# Patient Record
Sex: Female | Born: 1945 | Race: White | Hispanic: No | Marital: Single | Smoking: Never smoker
Health system: Southern US, Community
[De-identification: ages and names within clinical notes are randomized; demographics above are authoritative.]

## PROBLEM LIST (undated history)

## (undated) DIAGNOSIS — K219 Gastro-esophageal reflux disease without esophagitis: Secondary | ICD-10-CM

## (undated) DIAGNOSIS — I10 Essential (primary) hypertension: Secondary | ICD-10-CM

## (undated) DIAGNOSIS — J45909 Unspecified asthma, uncomplicated: Secondary | ICD-10-CM

---

## 2013-04-06 ENCOUNTER — Ambulatory Visit: Payer: Self-pay | Admitting: Family Medicine

## 2013-05-26 DIAGNOSIS — R079 Chest pain, unspecified: Secondary | ICD-10-CM | POA: Insufficient documentation

## 2014-06-30 DIAGNOSIS — K219 Gastro-esophageal reflux disease without esophagitis: Secondary | ICD-10-CM | POA: Insufficient documentation

## 2017-02-05 ENCOUNTER — Other Ambulatory Visit: Payer: Self-pay | Admitting: Family Medicine

## 2017-02-05 DIAGNOSIS — N6320 Unspecified lump in the left breast, unspecified quadrant: Secondary | ICD-10-CM

## 2017-02-25 ENCOUNTER — Ambulatory Visit
Admission: RE | Admit: 2017-02-25 | Discharge: 2017-02-25 | Disposition: A | Discharge status: Home / Self Care | Payer: Medicare Other | Source: Ambulatory Visit | Attending: Family Medicine | Admitting: Family Medicine

## 2017-02-25 ENCOUNTER — Encounter: Payer: Self-pay | Admitting: Radiology

## 2017-02-25 ENCOUNTER — Other Ambulatory Visit: Payer: Self-pay | Admitting: Family Medicine

## 2017-02-25 DIAGNOSIS — N6002 Solitary cyst of left breast: Secondary | ICD-10-CM | POA: Insufficient documentation

## 2017-02-25 DIAGNOSIS — N6310 Unspecified lump in the right breast, unspecified quadrant: Secondary | ICD-10-CM

## 2017-02-25 DIAGNOSIS — N6001 Solitary cyst of right breast: Secondary | ICD-10-CM | POA: Insufficient documentation

## 2017-02-25 DIAGNOSIS — N6320 Unspecified lump in the left breast, unspecified quadrant: Secondary | ICD-10-CM

## 2017-02-25 DIAGNOSIS — R928 Other abnormal and inconclusive findings on diagnostic imaging of breast: Secondary | ICD-10-CM | POA: Diagnosis present

## 2017-02-26 ENCOUNTER — Other Ambulatory Visit: Payer: Self-pay | Admitting: Family Medicine

## 2017-02-26 DIAGNOSIS — N6002 Solitary cyst of left breast: Secondary | ICD-10-CM

## 2017-02-26 DIAGNOSIS — R928 Other abnormal and inconclusive findings on diagnostic imaging of breast: Secondary | ICD-10-CM

## 2017-03-18 ENCOUNTER — Ambulatory Visit
Admission: RE | Admit: 2017-03-18 | Discharge: 2017-03-18 | Disposition: A | Discharge status: Home / Self Care | Payer: Medicare Other | Source: Ambulatory Visit | Attending: Family Medicine | Admitting: Family Medicine

## 2017-03-18 ENCOUNTER — Other Ambulatory Visit: Payer: Self-pay | Admitting: Family Medicine

## 2017-03-18 DIAGNOSIS — N6002 Solitary cyst of left breast: Secondary | ICD-10-CM | POA: Insufficient documentation

## 2017-03-18 DIAGNOSIS — R928 Other abnormal and inconclusive findings on diagnostic imaging of breast: Secondary | ICD-10-CM

## 2017-03-18 HISTORY — PX: BREAST CYST ASPIRATION: SHX578

## 2018-08-07 IMAGING — MG US BREAST CYST ASPIRATION 1ST CYST
1 series · 5 of 5 positions shown · non-contrast
Comparison: Previous exams.

CLINICAL DATA: Left breast probable cyst for aspiration.

EXAM:
ULTRASOUND GUIDED LEFT BREAST CYST ASPIRATION

[Series 1: MG view · 0.05mm/px · 5 of 5 slices shown]
[im 1/5]
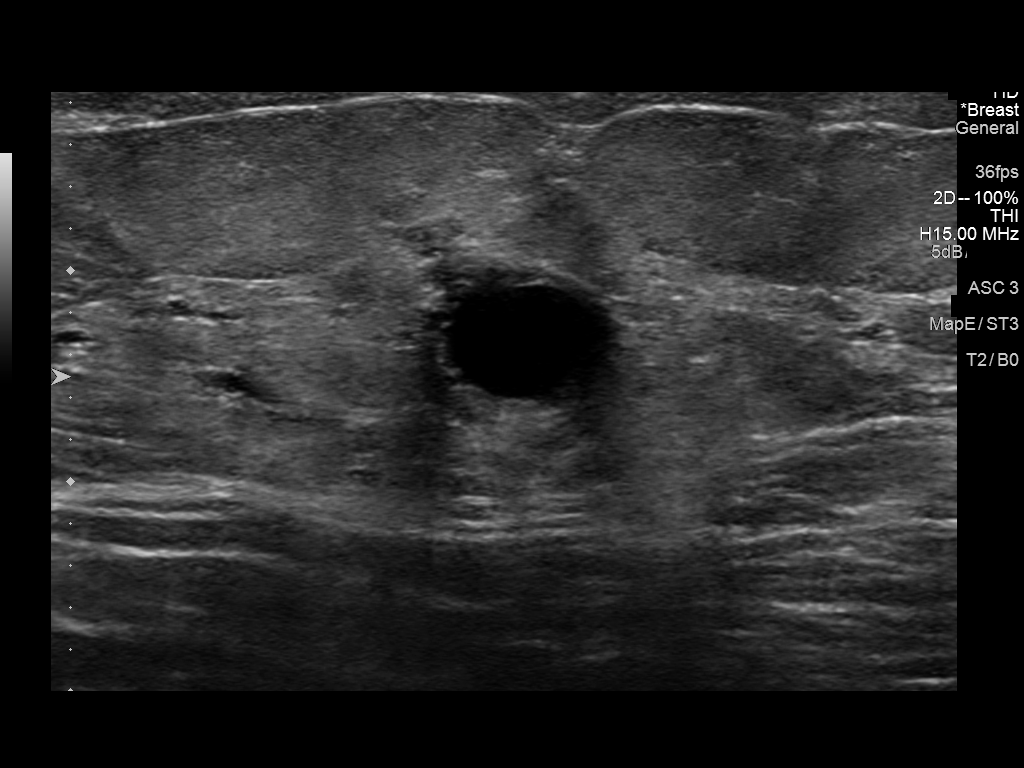
[im 2/5]
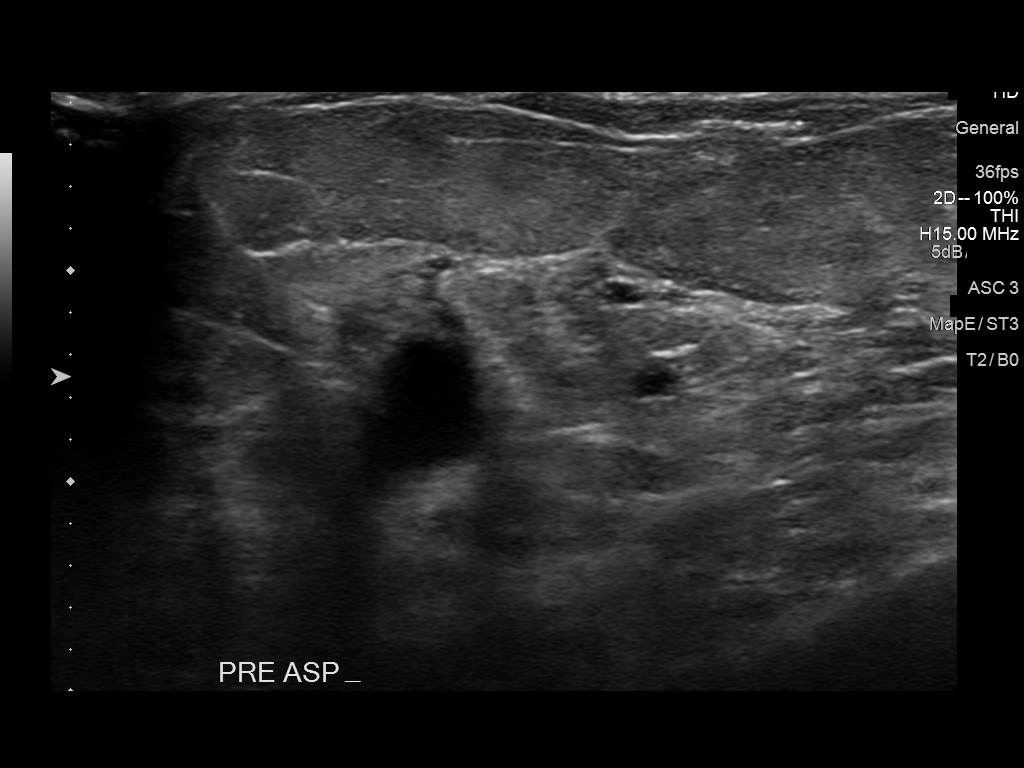
[im 3/5]
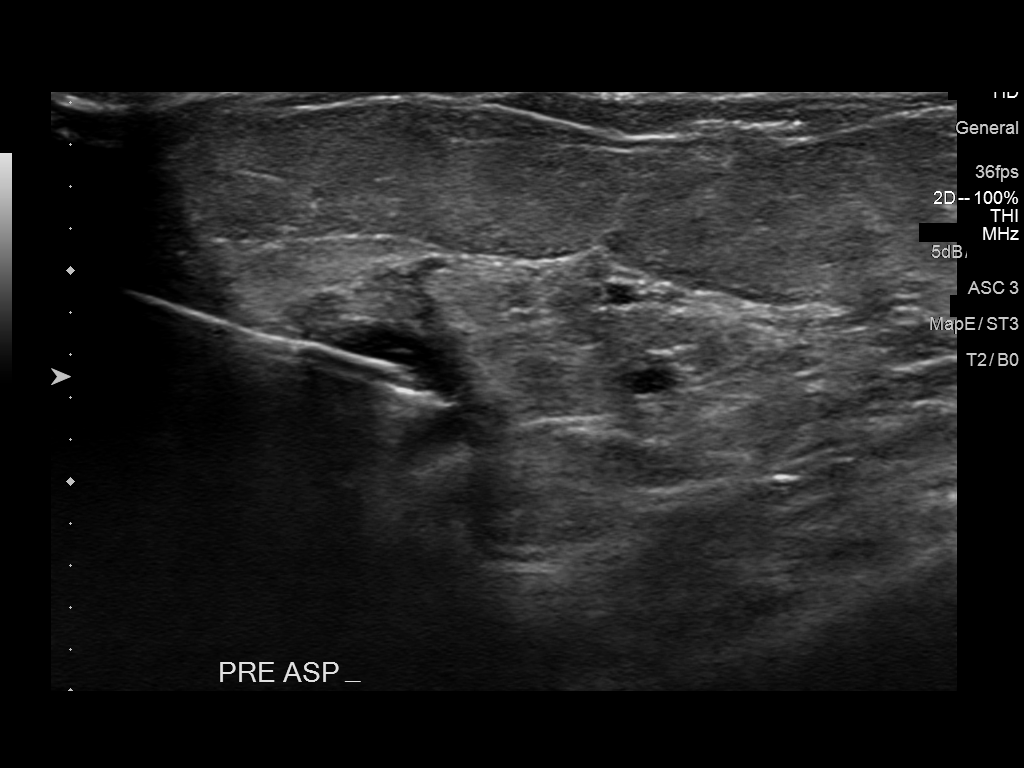
[im 4/5]
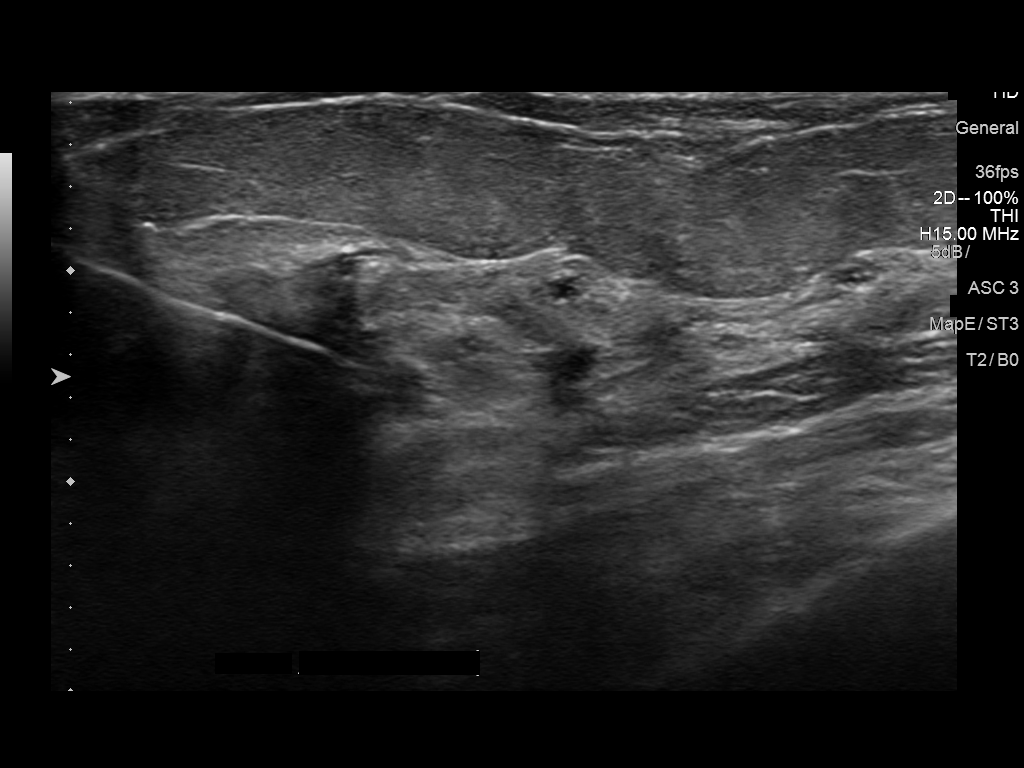
[im 5/5]
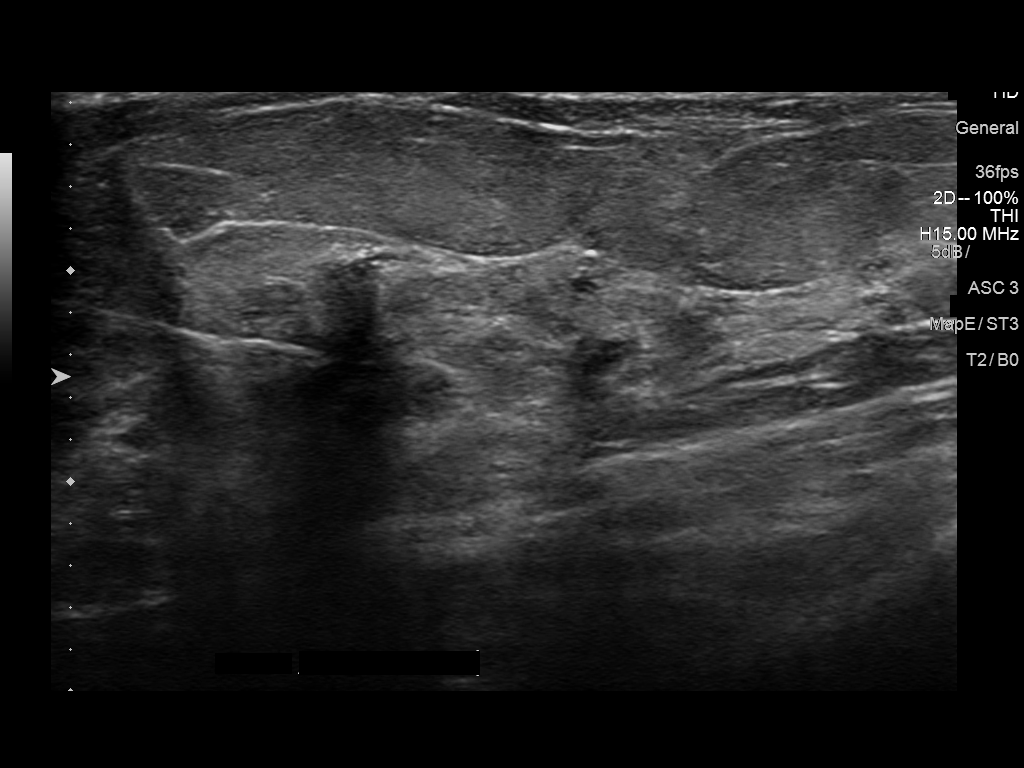

[5 of 5 positions shown; findings below may reference images not displayed]

PROCEDURE:
Using sterile technique, 1% lidocaine, under direct ultrasound
visualization, needle aspiration of anechoic lesion at the left
breast 12:30 o'clock 1 cm from nipple was performed. 0.25 mL of
slight yellow fluid was aspirated. There is complete collapse of the
mass post aspiration.
IMPRESSION: Ultrasound-guided aspiration of left breast cyst no apparent
complications.

RECOMMENDATIONS:
Routine screening mammogram back on schedule.

## 2018-10-08 DIAGNOSIS — E785 Hyperlipidemia, unspecified: Secondary | ICD-10-CM | POA: Insufficient documentation

## 2018-10-08 DIAGNOSIS — K920 Hematemesis: Secondary | ICD-10-CM | POA: Insufficient documentation

## 2018-10-08 DIAGNOSIS — I10 Essential (primary) hypertension: Secondary | ICD-10-CM | POA: Insufficient documentation

## 2018-10-08 DIAGNOSIS — K922 Gastrointestinal hemorrhage, unspecified: Secondary | ICD-10-CM | POA: Insufficient documentation

## 2018-10-08 DIAGNOSIS — R571 Hypovolemic shock: Secondary | ICD-10-CM | POA: Insufficient documentation

## 2018-10-08 DIAGNOSIS — K921 Melena: Secondary | ICD-10-CM | POA: Insufficient documentation

## 2019-01-28 ENCOUNTER — Encounter: Payer: Self-pay | Admitting: Gastroenterology

## 2019-01-28 ENCOUNTER — Other Ambulatory Visit: Payer: Self-pay

## 2019-01-28 ENCOUNTER — Encounter (INDEPENDENT_AMBULATORY_CARE_PROVIDER_SITE_OTHER): Payer: Self-pay

## 2019-01-28 ENCOUNTER — Ambulatory Visit (INDEPENDENT_AMBULATORY_CARE_PROVIDER_SITE_OTHER): Payer: Medicare Other | Admitting: Gastroenterology

## 2019-01-28 VITALS — BP 130/78 | HR 93 | Temp 96.1°F | Ht 63.5 in | Wt 173.2 lb

## 2019-01-28 DIAGNOSIS — H3530 Unspecified macular degeneration: Secondary | ICD-10-CM | POA: Insufficient documentation

## 2019-01-28 DIAGNOSIS — K254 Chronic or unspecified gastric ulcer with hemorrhage: Secondary | ICD-10-CM

## 2019-01-28 NOTE — H&P (View-Only) (Signed)
Gastroenterology Consultation  Referring Provider:     Hortencia Pilar, MD Primary Care Physician:  Tina Pilar, MD Primary Gastroenterologist:  Dr. Allen Mcintyre     Reason for Consultation:     GI bleed        HPI:   Tina Mcintyre is a 73 y.o. y/o female referred for consultation & management of GI bleed by Dr. Hortencia Pilar, MD.  This patient comes to see me today after being referred by her primary care physician for a history of peptic ulcer disease.  The patient was seen by her PCP in November and referred to me.  Prior to this the patient was in the hospital at [facility] for a GI bleed.  The patient had an upper endoscopy that showed her to have a nonbleeding gastric ulcer.  The recommendations were Protonix twice a day for 8 weeks and a repeat EGD to assess response.  The patient had reported that she was feeling weak during the summer with inability to finish mowing the lawn at times.  During her admission the patient was found to have a hemoglobin of 12 4 that went down to 9.3 the day after admission.  This was on September 9 and 10.  Repeat blood work on 28 September showed the hemoglobin to have risen to 12.1 and on 30 November the hemoglobin was 13.5.  The patient had reported some continued heartburn despite taking a PPI but she reports the symptoms to be much less on medication.  History reviewed. No pertinent past medical history.  Past Surgical History:  Procedure Laterality Date  . BREAST CYST ASPIRATION Left 03/18/2017    Prior to Admission medications   Not on File    Family History  Problem Relation Age of Onset  . Breast cancer Maternal Grandmother 65  . Breast cancer Cousin 68     Social History   Tobacco Use  . Smoking status: Never Smoker  . Smokeless tobacco: Never Used  Substance Use Topics  . Alcohol use: Never  . Drug use: Never    Allergies as of 01/28/2019 - Review Complete 01/28/2019  Allergen Reaction Noted  . Penicillins Anaphylaxis  01/08/2011  . Benzonatate  02/04/2012  . Codeine  01/31/2012  . Hydrocodone  01/31/2012  . Peanut oil Hives 07/17/2011  . Eggs or egg-derived products Nausea And Vomiting and Other (See Comments) 05/25/2013    Review of Systems:    All systems reviewed and negative except where noted in HPI.   Physical Exam:  BP 130/78   Pulse 93   Temp (!) 96.1 F (35.6 C) (Temporal)   Ht 5' 3.5" (1.613 m)   Wt 173 lb 3.2 oz (78.6 kg)   BMI 30.20 kg/m  No LMP recorded. Patient is postmenopausal. General:   Alert,  Well-developed, well-nourished, pleasant and cooperative in NAD Head:  Normocephalic and atraumatic. Eyes:  Sclera clear, no icterus.   Conjunctiva pink. Ears:  Normal auditory acuity. Neck:  Supple; no masses or thyromegaly. Lungs:  Respirations even and unlabored.  Clear throughout to auscultation.   No wheezes, crackles, or rhonchi. No acute distress. Heart:  Regular rate and rhythm; no murmurs, clicks, rubs, or gallops. Abdomen:  Normal bowel sounds.  No bruits.  Soft, non-tender and non-distended without masses, hepatosplenomegaly or hernias noted.  No guarding or rebound tenderness.  Negative Carnett sign.   Rectal:  Deferred.  Msk:  Symmetrical without gross deformities.  Good, equal movement & strength bilaterally. Pulses:  Normal  pulses noted. Extremities:  No clubbing or edema.  No cyanosis. Neurologic:  Alert and oriented x3;  grossly normal neurologically. Skin:  Intact without significant lesions or rashes.  No jaundice. Lymph Nodes:  No significant cervical adenopathy. Psych:  Alert and cooperative. Normal mood and affect.  Imaging Studies: No results found.  Assessment and Plan:   Tina Mcintyre is a 73 y.o. y/o female who comes in today with a history of peptic ulcer disease and a GI bleed at [facility].  The patient was recommended to have a repeat upper endoscopy to assess for ulcer healing in 8 weeks.  The patient was never contacted by that practice and  was recommended to come see me.  The patient will be set up for an EGD and will continue on her present medication since it is helping her.  The patient's hemoglobin at last check was normal and the patient has had no further signs of any bleeding such as black stools or bloody stools.  The patient has been explained the plan and agrees with it.    [name], MD. [name]    Note: This dictation was prepared with Dragon dictation along with smaller phrase technology. Any transcriptional errors that result from this process are unintentional.

## 2019-01-28 NOTE — Progress Notes (Signed)
Gastroenterology Consultation  Referring Provider:     Hortencia Pilar, MD Primary Care Physician:  Hortencia Pilar, MD Primary Gastroenterologist:  Dr. Allen Norris     Reason for Consultation:     GI bleed        HPI:   Tina Mcintyre is a 73 y.o. y/o female referred for consultation & management of GI bleed by Dr. Hortencia Pilar, MD.  This patient comes to see me today after being referred by her primary care physician for a history of peptic ulcer disease.  The patient was seen by her PCP in November and referred to me.  Prior to this the patient was in the hospital at [facility] for a GI bleed.  The patient had an upper endoscopy that showed her to have a nonbleeding gastric ulcer.  The recommendations were Protonix twice a day for 8 weeks and a repeat EGD to assess response.  The patient had reported that she was feeling weak during the summer with inability to finish mowing the lawn at times.  During her admission the patient was found to have a hemoglobin of 12 4 that went down to 9.3 the day after admission.  This was on September 9 and 10.  Repeat blood work on 28 September showed the hemoglobin to have risen to 12.1 and on 30 November the hemoglobin was 13.5.  The patient had reported some continued heartburn despite taking a PPI but she reports the symptoms to be much less on medication.  History reviewed. No pertinent past medical history.  Past Surgical History:  Procedure Laterality Date  . BREAST CYST ASPIRATION Left 03/18/2017    Prior to Admission medications   Not on File    Family History  Problem Relation Age of Onset  . Breast cancer Maternal Grandmother 65  . Breast cancer Cousin 68     Social History   Tobacco Use  . Smoking status: Never Smoker  . Smokeless tobacco: Never Used  Substance Use Topics  . Alcohol use: Never  . Drug use: Never    Allergies as of 01/28/2019 - Review Complete 01/28/2019  Allergen Reaction Noted  . Penicillins Anaphylaxis  01/08/2011  . Benzonatate  02/04/2012  . Codeine  01/31/2012  . Hydrocodone  01/31/2012  . Peanut oil Hives 07/17/2011  . Eggs or egg-derived products Nausea And Vomiting and Other (See Comments) 05/25/2013    Review of Systems:    All systems reviewed and negative except where noted in HPI.   Physical Exam:  BP 130/78   Pulse 93   Temp (!) 96.1 F (35.6 C) (Temporal)   Ht 5' 3.5" (1.613 m)   Wt 173 lb 3.2 oz (78.6 kg)   BMI 30.20 kg/m  No LMP recorded. Patient is postmenopausal. General:   Alert,  Well-developed, well-nourished, pleasant and cooperative in NAD Head:  Normocephalic and atraumatic. Eyes:  Sclera clear, no icterus.   Conjunctiva pink. Ears:  Normal auditory acuity. Neck:  Supple; no masses or thyromegaly. Lungs:  Respirations even and unlabored.  Clear throughout to auscultation.   No wheezes, crackles, or rhonchi. No acute distress. Heart:  Regular rate and rhythm; no murmurs, clicks, rubs, or gallops. Abdomen:  Normal bowel sounds.  No bruits.  Soft, non-tender and non-distended without masses, hepatosplenomegaly or hernias noted.  No guarding or rebound tenderness.  Negative Carnett sign.   Rectal:  Deferred.  Msk:  Symmetrical without gross deformities.  Good, equal movement & strength bilaterally. Pulses:  Normal  pulses noted. Extremities:  No clubbing or edema.  No cyanosis. Neurologic:  Alert and oriented x3;  grossly normal neurologically. Skin:  Intact without significant lesions or rashes.  No jaundice. Lymph Nodes:  No significant cervical adenopathy. Psych:  Alert and cooperative. Normal mood and affect.  Imaging Studies: No results found.  Assessment and Plan:   Tina Mcintyre is a 73 y.o. y/o female who comes in today with a history of peptic ulcer disease and a GI bleed at [facility].  The patient was recommended to have a repeat upper endoscopy to assess for ulcer healing in 8 weeks.  The patient was never contacted by that practice and  was recommended to come see me.  The patient will be set up for an EGD and will continue on her present medication since it is helping her.  The patient's hemoglobin at last check was normal and the patient has had no further signs of any bleeding such as black stools or bloody stools.  The patient has been explained the plan and agrees with it.    [name], MD. [name]    Note: This dictation was prepared with Dragon dictation along with smaller phrase technology. Any transcriptional errors that result from this process are unintentional.

## 2019-02-02 ENCOUNTER — Other Ambulatory Visit: Payer: Self-pay

## 2019-02-02 ENCOUNTER — Encounter: Payer: Self-pay | Admitting: Gastroenterology

## 2019-02-02 ENCOUNTER — Telehealth: Payer: Self-pay | Admitting: Gastroenterology

## 2019-02-02 NOTE — Telephone Encounter (Signed)
Pt is calling for Tina Mcintyre  Regarding her Covid testing she was told they no longer do the drive through that she needed an apt for it and she is confused please call pt

## 2019-02-02 NOTE — Telephone Encounter (Signed)
Spoke with pt regarding her COVID testing date and location. Also, spoke with pt's daughter regarding this information as well. Address and driving instructions were given to her as they were very confused about where she was to go.

## 2019-02-05 ENCOUNTER — Other Ambulatory Visit
Admission: RE | Admit: 2019-02-05 | Discharge: 2019-02-05 | Disposition: A | Discharge status: Home / Self Care | Payer: Medicare Other | Source: Ambulatory Visit | Attending: Gastroenterology | Admitting: Gastroenterology

## 2019-02-05 ENCOUNTER — Other Ambulatory Visit: Payer: Self-pay

## 2019-02-05 ENCOUNTER — Other Ambulatory Visit: Payer: Medicare Other

## 2019-02-05 DIAGNOSIS — Z01812 Encounter for preprocedural laboratory examination: Secondary | ICD-10-CM | POA: Diagnosis present

## 2019-02-05 DIAGNOSIS — Z20822 Contact with and (suspected) exposure to covid-19: Secondary | ICD-10-CM | POA: Insufficient documentation

## 2019-02-06 LAB — SARS CORONAVIRUS 2 (TAT 6-24 HRS): SARS Coronavirus 2: NEGATIVE

## 2019-02-06 NOTE — Discharge Instructions (Signed)
General Anesthesia, Adult, Care After This sheet gives you information about how to care for yourself after your procedure. Your health care provider may also give you more specific instructions. If you have problems or questions, contact your health care provider. What can I expect after the procedure? After the procedure, the following side effects are common:  Pain or discomfort at the IV site.  Nausea.  Vomiting.  Sore throat.  Trouble concentrating.  Feeling cold or chills.  Weak or tired.  Sleepiness and fatigue.  Soreness and body aches. These side effects can affect parts of the body that were not involved in surgery. Follow these instructions at home:  For at least 24 hours after the procedure:  Have a responsible adult stay with you. It is important to have someone help care for you until you are awake and alert.  Rest as needed.  Do not: ? Participate in activities in which you could fall or become injured. ? Drive. ? Use heavy machinery. ? Drink alcohol. ? Take sleeping pills or medicines that cause drowsiness. ? Make important decisions or sign legal documents. ? Take care of children on your own. Eating and drinking  Follow any instructions from your health care provider about eating or drinking restrictions.  When you feel hungry, start by eating small amounts of foods that are soft and easy to digest (bland), such as toast. Gradually return to your regular diet.  Drink enough fluid to keep your urine pale yellow.  If you vomit, rehydrate by drinking water, juice, or clear broth. General instructions  If you have sleep apnea, surgery and certain medicines can increase your risk for breathing problems. Follow instructions from your health care provider about wearing your sleep device: ? Anytime you are sleeping, including during daytime naps. ? While taking prescription pain medicines, sleeping medicines, or medicines that make you drowsy.  Return to  your normal activities as told by your health care provider. Ask your health care provider what activities are safe for you.  Take over-the-counter and prescription medicines only as told by your health care provider.  If you smoke, do not smoke without supervision.  Keep all follow-up visits as told by your health care provider. This is important. Contact a health care provider if:  You have nausea or vomiting that does not get better with medicine.  You cannot eat or drink without vomiting.  You have pain that does not get better with medicine.  You are unable to pass urine.  You develop a skin rash.  You have a fever.  You have redness around your IV site that gets worse. Get help right away if:  You have difficulty breathing.  You have chest pain.  You have blood in your urine or stool, or you vomit blood. Summary  After the procedure, it is common to have a sore throat or nausea. It is also common to feel tired.  Have a responsible adult stay with you for the first 24 hours after general anesthesia. It is important to have someone help care for you until you are awake and alert.  When you feel hungry, start by eating small amounts of foods that are soft and easy to digest (bland), such as toast. Gradually return to your regular diet.  Drink enough fluid to keep your urine pale yellow.  Return to your normal activities as told by your health care provider. Ask your health care provider what activities are safe for you. This information is not   intended to replace advice given to you by your health care provider. Make sure you discuss any questions you have with your health care provider. Document Revised: 01/18/2017 Document Reviewed: 08/31/2016 Elsevier Patient Education  2020 Elsevier Inc.  

## 2019-02-09 ENCOUNTER — Other Ambulatory Visit: Payer: Self-pay

## 2019-02-09 ENCOUNTER — Ambulatory Visit: Payer: Medicare Other | Admitting: Anesthesiology

## 2019-02-09 ENCOUNTER — Encounter
Admission: RE | Disposition: A | Discharge status: Home / Self Care | Payer: Self-pay | Source: Home / Self Care | Attending: Gastroenterology

## 2019-02-09 ENCOUNTER — Ambulatory Visit
Admission: RE | Admit: 2019-02-09 | Discharge: 2019-02-09 | Disposition: A | Discharge status: Home / Self Care | Payer: Medicare Other | Attending: Gastroenterology | Admitting: Gastroenterology

## 2019-02-09 DIAGNOSIS — Z885 Allergy status to narcotic agent status: Secondary | ICD-10-CM | POA: Diagnosis not present

## 2019-02-09 DIAGNOSIS — Z8711 Personal history of peptic ulcer disease: Secondary | ICD-10-CM | POA: Insufficient documentation

## 2019-02-09 DIAGNOSIS — K319 Disease of stomach and duodenum, unspecified: Secondary | ICD-10-CM | POA: Diagnosis not present

## 2019-02-09 DIAGNOSIS — J45909 Unspecified asthma, uncomplicated: Secondary | ICD-10-CM | POA: Diagnosis not present

## 2019-02-09 DIAGNOSIS — Z880 Allergy status to penicillin: Secondary | ICD-10-CM | POA: Diagnosis not present

## 2019-02-09 DIAGNOSIS — K279 Peptic ulcer, site unspecified, unspecified as acute or chronic, without hemorrhage or perforation: Secondary | ICD-10-CM | POA: Diagnosis not present

## 2019-02-09 DIAGNOSIS — K449 Diaphragmatic hernia without obstruction or gangrene: Secondary | ICD-10-CM | POA: Insufficient documentation

## 2019-02-09 DIAGNOSIS — K222 Esophageal obstruction: Secondary | ICD-10-CM | POA: Diagnosis not present

## 2019-02-09 DIAGNOSIS — K219 Gastro-esophageal reflux disease without esophagitis: Secondary | ICD-10-CM | POA: Insufficient documentation

## 2019-02-09 DIAGNOSIS — I10 Essential (primary) hypertension: Secondary | ICD-10-CM | POA: Insufficient documentation

## 2019-02-09 DIAGNOSIS — Z09 Encounter for follow-up examination after completed treatment for conditions other than malignant neoplasm: Secondary | ICD-10-CM | POA: Insufficient documentation

## 2019-02-09 DIAGNOSIS — K274 Chronic or unspecified peptic ulcer, site unspecified, with hemorrhage: Secondary | ICD-10-CM | POA: Diagnosis not present

## 2019-02-09 DIAGNOSIS — K254 Chronic or unspecified gastric ulcer with hemorrhage: Secondary | ICD-10-CM

## 2019-02-09 HISTORY — PX: ESOPHAGOGASTRODUODENOSCOPY (EGD) WITH PROPOFOL: SHX5813

## 2019-02-09 HISTORY — DX: Essential (primary) hypertension: I10

## 2019-02-09 HISTORY — DX: Unspecified asthma, uncomplicated: J45.909

## 2019-02-09 HISTORY — DX: Gastro-esophageal reflux disease without esophagitis: K21.9

## 2019-02-09 SURGERY — ESOPHAGOGASTRODUODENOSCOPY (EGD) WITH PROPOFOL
Anesthesia: General | Site: Mouth

## 2019-02-09 MED ORDER — LIDOCAINE HCL (CARDIAC) PF 100 MG/5ML IV SOSY
PREFILLED_SYRINGE | INTRAVENOUS | Status: DC | PRN
  Administered 2019-02-09: 30 mg via INTRAVENOUS

## 2019-02-09 MED ORDER — LACTATED RINGERS IV SOLN: INTRAVENOUS | Status: DC

## 2019-02-09 MED ORDER — GLYCOPYRROLATE 0.2 MG/ML IJ SOLN
INTRAMUSCULAR | Status: DC | PRN
  Administered 2019-02-09: .1 mg via INTRAVENOUS

## 2019-02-09 MED ORDER — PROPOFOL 10 MG/ML IV BOLUS
INTRAVENOUS | Status: DC | PRN
  Administered 2019-02-09: 100 mg via INTRAVENOUS
  Administered 2019-02-09: 40 mg via INTRAVENOUS
  Administered 2019-02-09: 20 mg via INTRAVENOUS

## 2019-02-09 MED ORDER — SODIUM CHLORIDE 0.9 % IV SOLN: INTRAVENOUS | Status: DC

## 2019-02-09 SURGICAL SUPPLY — 7 items
BLOCK BITE 60FR ADLT L/F GRN (MISCELLANEOUS) ×3 IMPLANT
CANISTER SUCT 1200ML W/VALVE (MISCELLANEOUS) ×3 IMPLANT
FORCEPS BIOP RAD 4 LRG CAP 4 (CUTTING FORCEPS) ×3 IMPLANT
GOWN CVR UNV OPN BCK APRN NK (MISCELLANEOUS) ×2 IMPLANT
GOWN ISOL THUMB LOOP REG UNIV (MISCELLANEOUS) ×4
KIT ENDO PROCEDURE OLY (KITS) ×3 IMPLANT
WATER STERILE IRR 250ML POUR (IV SOLUTION) ×3 IMPLANT

## 2019-02-09 NOTE — Op Note (Signed)
[facility] Gastroenterology Patient Name: Tina Mcintyre Procedure Date: 02/09/2019 9:03 AM MRN: [id number] Account #: [id number] Date of Birth: July 15, 1945 Admit Type: Outpatient Age: 74 Room: [facility] OR ROOM 01 Gender: Female Note Status: Finalized Procedure:             Upper GI endoscopy Indications:           Follow-up of peptic ulcer Providers:             [name] MD, MD Referring MD:          Letitia Caul MD, MD (Referring MD) Medicines:             Propofol per Anesthesia Complications:         No immediate complications. Procedure:             Pre-Anesthesia Assessment:                        - Prior to the procedure, a History and Physical was                         performed, and patient medications and allergies were                         reviewed. The patient's tolerance of previous                         anesthesia was also reviewed. The risks and benefits                         of the procedure and the sedation options and risks                         were discussed with the patient. All questions were                         answered, and informed consent was obtained. Prior                         Anticoagulants: The patient has taken no previous                         anticoagulant or antiplatelet agents. ASA Grade                         Assessment: II - A patient with mild systemic disease.                         After reviewing the risks and benefits, the patient                         was deemed in satisfactory condition to undergo the                         procedure.                        After obtaining informed consent, the endoscope was  passed under direct vision. Throughout the procedure,                         the patient's blood pressure, pulse, and oxygen                         saturations were monitored continuously. The was                         introduced through the mouth, and advanced  to the                         second part of duodenum. The upper GI endoscopy was                         accomplished without difficulty. The patient tolerated                         the procedure well. Findings:      A small hiatal hernia was present.      A single localized 4 mm erosion with stigmata of recent bleeding was       found in the gastric antrum. Biopsies were taken with a cold forceps for       histology.      The examined duodenum was normal.      A mild Schatzki ring was found at the gastroesophageal junction. Impression:            - Small hiatal hernia.                        - Erosive gastropathy with stigmata of recent                         bleeding. Biopsied.                        - Normal examined duodenum.                        - Mild Schatzki ring. Recommendation:        - Discharge patient to home.                        - Resume previous diet.                        - Continue present medications.                        - Await pathology results. Procedure Code(s):     --- Professional ---                        [phone], Esophagogastroduodenoscopy, flexible,                         transoral; with biopsy, single or multiple Diagnosis Code(s):     --- Professional ---                        K27.9, Peptic ulcer, site unspecified, unspecified as  acute or chronic, without hemorrhage or perforation                        K92.2, Gastrointestinal hemorrhage, unspecified CPT copyright 2019 American Medical Association. All rights reserved. The codes documented in this report are preliminary and upon coder review may  be revised to meet current compliance requirements. Lucilla Lame MD, MD 02/09/2019 9:18:06 AM This report has been signed electronically. Number of Addenda: 0 Note Initiated On: 02/09/2019 9:03 AM Total Procedure Duration: 0 hours 2 minutes 1 second  Estimated Blood Loss:  Estimated blood loss: none.      [facility]

## 2019-02-09 NOTE — Anesthesia Preprocedure Evaluation (Signed)
Anesthesia Evaluation  Patient identified by MRN, date of birth, ID band Patient awake    Reviewed: Allergy & Precautions, H&P , NPO status , Patient's Chart, lab work & pertinent test results, reviewed documented beta blocker date and time   Airway Mallampati: II  TM Distance: >3 FB Neck ROM: full    Dental no notable dental hx.    Pulmonary asthma ,    Pulmonary exam normal breath sounds clear to auscultation       Cardiovascular Exercise Tolerance: Good hypertension,  Rhythm:regular Rate:Normal     Neuro/Psych negative neurological ROS  negative psych ROS   GI/Hepatic Neg liver ROS, GERD  ,  Endo/Other  negative endocrine ROS  Renal/GU negative Renal ROS  negative genitourinary   Musculoskeletal   Abdominal   Peds  Hematology negative hematology ROS (+)   Anesthesia Other Findings   Reproductive/Obstetrics negative OB ROS                             Anesthesia Physical Anesthesia Plan  ASA: II  Anesthesia Plan: General   Post-op Pain Management:    Induction:   PONV Risk Score and Plan: 3 and TIVA, Propofol infusion and Treatment may vary due to age or medical condition  Airway Management Planned:   Additional Equipment:   Intra-op Plan:   Post-operative Plan:   Informed Consent: I have reviewed the patients History and Physical, chart, labs and discussed the procedure including the risks, benefits and alternatives for the proposed anesthesia with the patient or authorized representative who has indicated his/her understanding and acceptance.     Dental Advisory Given  Plan Discussed with: CRNA  Anesthesia Plan Comments:         Anesthesia Quick Evaluation

## 2019-02-09 NOTE — Transfer of Care (Signed)
Immediate Anesthesia Transfer of Care Note  Patient: Tina Mcintyre  Procedure(s) Performed: ESOPHAGOGASTRODUODENOSCOPY (EGD) WITH BIOPSY (N/A Mouth)  Patient Location: PACU  Anesthesia Type: General  Level of Consciousness: awake, alert  and patient cooperative  Airway and Oxygen Therapy: Patient Spontanous Breathing and Patient connected to supplemental oxygen  Post-op Assessment: Post-op Vital signs reviewed, Patient's Cardiovascular Status Stable, Respiratory Function Stable, Patent Airway and No signs of Nausea or vomiting  Post-op Vital Signs: Reviewed and stable  Complications: No apparent anesthesia complications

## 2019-02-09 NOTE — Anesthesia Procedure Notes (Signed)
Date/Time: 02/09/2019 9:08 AM Performed by: Maree Krabbe, CRNA Pre-anesthesia Checklist: Patient identified, Emergency Drugs available, Suction available, Timeout performed and Patient being monitored Patient Re-evaluated:Patient Re-evaluated prior to induction Oxygen Delivery Method: Nasal cannula Placement Confirmation: positive ETCO2

## 2019-02-09 NOTE — Anesthesia Postprocedure Evaluation (Signed)
Anesthesia Post Note  Patient: Tina Mcintyre  Procedure(s) Performed: ESOPHAGOGASTRODUODENOSCOPY (EGD) WITH BIOPSY (N/A Mouth)     Patient location during evaluation: PACU Anesthesia Type: General Level of consciousness: awake and alert Pain management: pain level controlled Vital Signs Assessment: post-procedure vital signs reviewed and stable Respiratory status: spontaneous breathing, nonlabored ventilation, respiratory function stable and patient connected to nasal cannula oxygen Cardiovascular status: blood pressure returned to baseline and stable Postop Assessment: no apparent nausea or vomiting Anesthetic complications: no    Scarlette Slice

## 2019-02-09 NOTE — Interval H&P Note (Signed)
History and Physical Interval Note:  02/09/2019 8:24 AM  Tina Mcintyre  has presented today for surgery, with the diagnosis of GI bleed with gastric ulcer K25.4.  The various methods of treatment have been discussed with the patient and family. After consideration of risks, benefits and other options for treatment, the patient has consented to  Procedure(s): ESOPHAGOGASTRODUODENOSCOPY (EGD) WITH PROPOFOL (N/A) as a surgical intervention.  The patient's history has been reviewed, patient examined, no change in status, stable for surgery.  I have reviewed the patient's chart and labs.  Questions were answered to the patient's satisfaction.     Jasim Harari [employer]

## 2019-02-10 ENCOUNTER — Encounter: Payer: Self-pay | Admitting: *Deleted

## 2019-02-12 ENCOUNTER — Encounter: Payer: Self-pay | Admitting: Gastroenterology

## 2019-02-17 ENCOUNTER — Encounter: Payer: Self-pay | Admitting: Gastroenterology

## 2022-02-07 ENCOUNTER — Other Ambulatory Visit: Payer: Self-pay | Admitting: Internal Medicine

## 2022-02-07 DIAGNOSIS — R112 Nausea with vomiting, unspecified: Secondary | ICD-10-CM

## 2022-02-09 ENCOUNTER — Ambulatory Visit
Admission: RE | Admit: 2022-02-09 | Discharge: 2022-02-09 | Disposition: A | Discharge status: Home / Self Care | Payer: Medicare Other | Source: Ambulatory Visit | Attending: Internal Medicine | Admitting: Internal Medicine

## 2022-02-09 DIAGNOSIS — R112 Nausea with vomiting, unspecified: Secondary | ICD-10-CM | POA: Insufficient documentation

## 2022-02-09 MED ORDER — IOHEXOL 300 MG/ML  SOLN
100.0000 mL | Freq: Once | INTRAMUSCULAR | Status: AC | PRN
  Administered 2022-02-09: 100 mL via INTRAVENOUS
